# Patient Record
Sex: Female | Born: 1960
Health system: Southern US, Community
[De-identification: ages and names within clinical notes are randomized; demographics above are authoritative.]

---

## 2013-08-24 HISTORY — PX: REDUCTION MAMMAPLASTY: SUR839

## 2015-05-15 ENCOUNTER — Other Ambulatory Visit: Payer: Self-pay

## 2015-05-15 DIAGNOSIS — Z1231 Encounter for screening mammogram for malignant neoplasm of breast: Secondary | ICD-10-CM

## 2015-05-24 ENCOUNTER — Ambulatory Visit
Admission: RE | Admit: 2015-05-24 | Discharge: 2015-05-24 | Disposition: A | Discharge status: Home / Self Care | Payer: Federal, State, Local not specified - PPO | Source: Ambulatory Visit

## 2015-05-24 DIAGNOSIS — Z1231 Encounter for screening mammogram for malignant neoplasm of breast: Secondary | ICD-10-CM

## 2016-05-06 ENCOUNTER — Other Ambulatory Visit (HOSPITAL_COMMUNITY)
Admission: RE | Admit: 2016-05-06 | Discharge: 2016-05-06 | Disposition: A | Discharge status: Home / Self Care | Payer: Federal, State, Local not specified - PPO | Source: Ambulatory Visit | Attending: Family Medicine | Admitting: Family Medicine

## 2016-05-06 ENCOUNTER — Other Ambulatory Visit: Payer: Self-pay | Admitting: Family Medicine

## 2016-05-06 DIAGNOSIS — Z124 Encounter for screening for malignant neoplasm of cervix: Secondary | ICD-10-CM | POA: Diagnosis present

## 2016-05-06 DIAGNOSIS — Z1151 Encounter for screening for human papillomavirus (HPV): Secondary | ICD-10-CM | POA: Insufficient documentation

## 2016-05-07 ENCOUNTER — Other Ambulatory Visit: Payer: Self-pay | Admitting: Family Medicine

## 2016-05-07 DIAGNOSIS — E042 Nontoxic multinodular goiter: Secondary | ICD-10-CM

## 2016-05-08 LAB — CYTOLOGY - PAP

## 2016-06-03 ENCOUNTER — Ambulatory Visit
Admission: RE | Admit: 2016-06-03 | Discharge: 2016-06-03 | Disposition: A | Discharge status: Home / Self Care | Payer: Federal, State, Local not specified - PPO | Source: Ambulatory Visit | Attending: Family Medicine | Admitting: Family Medicine

## 2016-06-03 DIAGNOSIS — E042 Nontoxic multinodular goiter: Secondary | ICD-10-CM

## 2016-06-11 ENCOUNTER — Other Ambulatory Visit: Payer: Self-pay | Admitting: Family Medicine

## 2016-06-11 DIAGNOSIS — E041 Nontoxic single thyroid nodule: Secondary | ICD-10-CM

## 2016-06-23 ENCOUNTER — Other Ambulatory Visit (HOSPITAL_COMMUNITY)
Admission: RE | Admit: 2016-06-23 | Discharge: 2016-06-23 | Disposition: A | Discharge status: Home / Self Care | Payer: Federal, State, Local not specified - PPO | Source: Ambulatory Visit | Attending: Radiology | Admitting: Radiology

## 2016-06-23 ENCOUNTER — Ambulatory Visit
Admission: RE | Admit: 2016-06-23 | Discharge: 2016-06-23 | Disposition: A | Discharge status: Home / Self Care | Payer: Federal, State, Local not specified - PPO | Source: Ambulatory Visit | Attending: Family Medicine | Admitting: Family Medicine

## 2016-06-23 DIAGNOSIS — E041 Nontoxic single thyroid nodule: Secondary | ICD-10-CM

## 2017-04-29 ENCOUNTER — Encounter (HOSPITAL_BASED_OUTPATIENT_CLINIC_OR_DEPARTMENT_OTHER): Payer: Self-pay

## 2017-04-29 DIAGNOSIS — R5383 Other fatigue: Secondary | ICD-10-CM

## 2017-04-29 DIAGNOSIS — G4710 Hypersomnia, unspecified: Secondary | ICD-10-CM

## 2017-04-29 DIAGNOSIS — G4700 Insomnia, unspecified: Secondary | ICD-10-CM

## 2017-04-29 DIAGNOSIS — R0683 Snoring: Secondary | ICD-10-CM

## 2017-06-25 ENCOUNTER — Ambulatory Visit (HOSPITAL_BASED_OUTPATIENT_CLINIC_OR_DEPARTMENT_OTHER): Payer: Federal, State, Local not specified - PPO | Attending: Anesthesiology | Admitting: Internal Medicine

## 2017-06-25 VITALS — Ht 66.0 in | Wt 246.0 lb

## 2017-06-25 DIAGNOSIS — R0683 Snoring: Secondary | ICD-10-CM | POA: Diagnosis not present

## 2017-06-25 DIAGNOSIS — R400 Somnolence: Secondary | ICD-10-CM | POA: Insufficient documentation

## 2017-06-25 DIAGNOSIS — R5383 Other fatigue: Secondary | ICD-10-CM

## 2017-06-25 DIAGNOSIS — G4700 Insomnia, unspecified: Secondary | ICD-10-CM | POA: Diagnosis not present

## 2017-06-25 DIAGNOSIS — G4710 Hypersomnia, unspecified: Secondary | ICD-10-CM

## 2017-07-03 DIAGNOSIS — R0683 Snoring: Secondary | ICD-10-CM

## 2017-07-03 NOTE — Procedures (Signed)
Patient Name: Paige Green, Favor Study Date: 06/25/2017 Gender: Female D.O.B: Feb 04, 1961 Age (years): [age] Referring Provider: Roselie Awkwardharles Plummer Height (inches): 66 Interpreting Physician: Jetty Duhamellinton Glennda Weatherholtz MD, ABSM Weight (lbs): 246 RPSGT: Cherylann ParrDubili, Fred BMI: 40 MRN: [id number] Neck Size: 16.00 CLINICAL INFORMATION Sleep Study Type: NPSG  Indication for sleep study: Excessive Daytime Sleepiness, Fatigue, Obesity, OSA, Snoring, Witnessed Apneas  Epworth Sleepiness Score: 4  SLEEP STUDY TECHNIQUE As per the AASM Manual for the Scoring of Sleep and Associated Events v2.3 (April 2016) with a hypopnea requiring 4% desaturations.  The channels recorded and monitored were frontal, central and occipital EEG, electrooculogram (EOG), submentalis EMG (chin), nasal and oral airflow, thoracic and abdominal wall motion, anterior tibialis EMG, snore microphone, electrocardiogram, and pulse oximetry.  MEDICATIONS Medications self-administered by patient taken the night of the study : HYDROCOD/ACETAMOPHIN  SLEEP ARCHITECTURE The study was initiated at 10:57:03 PM and ended at 5:29:14 AM.  Sleep onset time was 18.1 minutes and the sleep efficiency was 64.9%. The total sleep time was 254.5 minutes.  Stage REM latency was 131.0 minutes.  The patient spent 5.30% of the night in stage N1 sleep, 74.85% in stage N2 sleep, 0.00% in stage N3 and 19.84% in REM.  Alpha intrusion was absent.  Supine sleep was 34.77%.  RESPIRATORY PARAMETERS The overall apnea/hypopnea index (AHI) was 3.5 per hour. There were 8 total apneas, including 7 obstructive, 1 central and 0 mixed apneas. There were 7 hypopneas and 0 RERAs.  The AHI during Stage REM sleep was 16.6 per hour.  AHI while supine was 0.0 per hour.  The mean oxygen saturation was 93.98%. The minimum SpO2 during sleep was 88.00%.  moderate snoring was noted during this study.  CARDIAC DATA The 2 lead EKG demonstrated sinus rhythm. The mean heart rate was  64.35 beats per minute. Other EKG findings include: None.  LEG MOVEMENT DATA The total PLMS were 0 with a resulting PLMS index of 0.00. Associated arousal with leg movement index was 0.0 .  IMPRESSIONS - No significant obstructive sleep apnea occurred during this study (AHI = 3.5/h). - No significant central sleep apnea occurred during this study (CAI = 0.2/h). - The patient had minimal or no oxygen desaturation during the study (Min O2 = 88.00%) - The patient snored with moderate snoring volume. - No cardiac abnormalities were noted during this study. - Clinically significant periodic limb movements did not occur during sleep. No significant associated arousals.  DIAGNOSIS - Primary Snoring (786.09 [R06.83 ICD-10])  RECOMMENDATIONS - Be careful with alcohol, sedatives and other CNS depressants that may worsen sleep apnea and disrupt normal sleep architecture. - Sleep hygiene should be reviewed to assess factors that may improve sleep quality. - Weight management and regular exercise should be initiated or continued if appropriate.  [Electronically signed] 07/03/2017 03:27 PM  Jetty Duhamellinton Daralyn Bert MD, ABSM Diplomate, American Board of Sleep Medicine   NPI: [id number]

## 2017-07-03 NOTE — Procedures (Deleted)
Patient Name: Paige Green, [name] Study Date: 06/25/2017 Gender: Female D.O.B: 09/21/1983 Age (years): 33 Referring Provider: Van ClinesKaren M Aquino Height (inches): 65 Interpreting Physician: Jetty Duhamellinton Allan Minotti MD, ABSM Weight (lbs): 186 RPSGT: Cherylann ParrDubili, Fred BMI: 31 MRN: [id number] Neck Size: 16.00 CLINICAL INFORMATION Sleep Study Type: NPSG  Indication for sleep study: Fatigue, Obesity, Snoring, Witnessed Apneas  Epworth Sleepiness Score: 4  SLEEP STUDY TECHNIQUE As per the AASM Manual for the Scoring of Sleep and Associated Events v2.3 (April 2016) with a hypopnea requiring 4% desaturations.  The channels recorded and monitored were frontal, central and occipital EEG, electrooculogram (EOG), submentalis EMG (chin), nasal and oral airflow, thoracic and abdominal wall motion, anterior tibialis EMG, snore microphone, electrocardiogram, and pulse oximetry.  MEDICATIONS Medications self-administered by patient taken the night of the study : none reported  SLEEP ARCHITECTURE The study was initiated at 10:16:45 PM and ended at 2:48:07 AM.  Sleep onset time was 15.5 minutes and the sleep efficiency was 30.4%. The total sleep time was 82.5 minutes.  Stage REM latency was N/A minutes.  The patient spent 28.48% of the night in stage N1 sleep, 71.52% in stage N2 sleep, 0.00% in stage N3 and 0.00% in REM.  Alpha intrusion was absent.  Supine sleep was 66.67%.  RESPIRATORY PARAMETERS The overall apnea/hypopnea index (AHI) was 5.8 per hour. There were 0 total apneas, including 0 obstructive, 0 central and 0 mixed apneas. There were 8 hypopneas and 0 RERAs.  The AHI during Stage REM sleep was N/A per hour.  AHI while supine was 3.3 per hour.  The mean oxygen saturation was 94.21%. The minimum SpO2 during sleep was 66.00%.  moderate snoring was noted during this study.  CARDIAC DATA The 2 lead EKG demonstrated sinus rhythm. The mean heart rate was 92.35 beats per minute. Other EKG findings  include: None.  LEG MOVEMENT DATA The total PLMS were 0 with a resulting PLMS index of 0.00. Associated arousal with leg movement index was 0.0 .  IMPRESSIONS - Minimal obstructive sleep apnea occurred during this study (AHI = 5.8/h). - No significant central sleep apnea occurred during this study (CAI = 0.0/h). - Oxygen desaturation was noted during this study (Min O2 = 66.00%, Mean 94.2%). - The patient snored with moderate snoring volume. - No cardiac abnormalities were noted during this study. - Clinically significant periodic limb movements did not occur during sleep. No significant associated arousals.  DIAGNOSIS - Obstructive Sleep Apnea (327.23 [G47.33 ICD-10]) - Nocturnal Hypoxemia (327.26 [G47.36 ICD-10])  RECOMMENDATIONS - Very mild obstructive sleep apnea. If more than conservative intervention is needed, an oral appliance, CPAP or ENT evaluation might be considered, based on clinical judgment. - Avoid alcohol, sedatives and other CNS depressants that may worsen sleep apnea and disrupt normal sleep architecture. - Sleep hygiene should be reviewed to assess factors that may improve sleep quality. - Weight management and regular exercise should be initiated or continued if appropriate.  [Electronically signed] 07/03/2017 03:21 PM  Jetty Duhamellinton Camelle Henkels MD, ABSM Diplomate, American Board of Sleep Medicine   NPI: [id number]

## 2017-07-07 DIAGNOSIS — M545 Low back pain: Secondary | ICD-10-CM | POA: Diagnosis not present

## 2017-07-07 DIAGNOSIS — M542 Cervicalgia: Secondary | ICD-10-CM | POA: Diagnosis not present

## 2017-07-07 DIAGNOSIS — G894 Chronic pain syndrome: Secondary | ICD-10-CM | POA: Diagnosis not present

## 2017-07-12 DIAGNOSIS — M791 Myalgia, unspecified site: Secondary | ICD-10-CM | POA: Diagnosis not present

## 2017-08-09 DIAGNOSIS — M150 Primary generalized (osteo)arthritis: Secondary | ICD-10-CM | POA: Diagnosis not present

## 2017-08-09 DIAGNOSIS — M797 Fibromyalgia: Secondary | ICD-10-CM | POA: Diagnosis not present

## 2017-08-09 DIAGNOSIS — M0579 Rheumatoid arthritis with rheumatoid factor of multiple sites without organ or systems involvement: Secondary | ICD-10-CM | POA: Diagnosis not present

## 2017-08-09 DIAGNOSIS — M2550 Pain in unspecified joint: Secondary | ICD-10-CM | POA: Diagnosis not present

## 2017-09-14 DIAGNOSIS — G894 Chronic pain syndrome: Secondary | ICD-10-CM | POA: Diagnosis not present

## 2017-09-14 DIAGNOSIS — Z79891 Long term (current) use of opiate analgesic: Secondary | ICD-10-CM | POA: Diagnosis not present

## 2017-09-14 DIAGNOSIS — M545 Low back pain: Secondary | ICD-10-CM | POA: Diagnosis not present

## 2017-09-14 DIAGNOSIS — M542 Cervicalgia: Secondary | ICD-10-CM | POA: Diagnosis not present

## 2017-09-22 DIAGNOSIS — M069 Rheumatoid arthritis, unspecified: Secondary | ICD-10-CM | POA: Diagnosis not present

## 2017-09-22 DIAGNOSIS — R079 Chest pain, unspecified: Secondary | ICD-10-CM | POA: Diagnosis not present

## 2017-09-22 DIAGNOSIS — I10 Essential (primary) hypertension: Secondary | ICD-10-CM | POA: Diagnosis not present

## 2017-09-22 DIAGNOSIS — Z1159 Encounter for screening for other viral diseases: Secondary | ICD-10-CM | POA: Diagnosis not present

## 2017-09-22 DIAGNOSIS — Z23 Encounter for immunization: Secondary | ICD-10-CM | POA: Diagnosis not present

## 2017-09-22 DIAGNOSIS — Z720 Tobacco use: Secondary | ICD-10-CM | POA: Diagnosis not present

## 2017-09-30 ENCOUNTER — Other Ambulatory Visit: Payer: Self-pay | Admitting: Cardiology

## 2017-09-30 ENCOUNTER — Other Ambulatory Visit: Payer: Self-pay | Admitting: Family Medicine

## 2017-09-30 ENCOUNTER — Ambulatory Visit
Admission: RE | Admit: 2017-09-30 | Discharge: 2017-09-30 | Disposition: A | Discharge status: Home / Self Care | Payer: Federal, State, Local not specified - PPO | Source: Ambulatory Visit | Attending: Cardiology | Admitting: Cardiology

## 2017-09-30 DIAGNOSIS — G4733 Obstructive sleep apnea (adult) (pediatric): Secondary | ICD-10-CM | POA: Diagnosis not present

## 2017-09-30 DIAGNOSIS — R079 Chest pain, unspecified: Secondary | ICD-10-CM | POA: Diagnosis not present

## 2017-09-30 DIAGNOSIS — R0602 Shortness of breath: Secondary | ICD-10-CM | POA: Diagnosis not present

## 2017-09-30 DIAGNOSIS — Z1231 Encounter for screening mammogram for malignant neoplasm of breast: Secondary | ICD-10-CM

## 2017-09-30 DIAGNOSIS — I10 Essential (primary) hypertension: Secondary | ICD-10-CM | POA: Diagnosis not present

## 2017-10-11 DIAGNOSIS — R079 Chest pain, unspecified: Secondary | ICD-10-CM | POA: Diagnosis not present

## 2017-10-12 DIAGNOSIS — M545 Low back pain: Secondary | ICD-10-CM | POA: Diagnosis not present

## 2017-10-12 DIAGNOSIS — M542 Cervicalgia: Secondary | ICD-10-CM | POA: Diagnosis not present

## 2017-10-12 DIAGNOSIS — G894 Chronic pain syndrome: Secondary | ICD-10-CM | POA: Diagnosis not present

## 2017-10-12 DIAGNOSIS — Z79891 Long term (current) use of opiate analgesic: Secondary | ICD-10-CM | POA: Diagnosis not present

## 2017-10-14 DIAGNOSIS — R079 Chest pain, unspecified: Secondary | ICD-10-CM | POA: Diagnosis not present

## 2017-11-04 ENCOUNTER — Ambulatory Visit: Payer: Federal, State, Local not specified - PPO

## 2017-11-08 DIAGNOSIS — M797 Fibromyalgia: Secondary | ICD-10-CM | POA: Diagnosis not present

## 2017-11-08 DIAGNOSIS — M150 Primary generalized (osteo)arthritis: Secondary | ICD-10-CM | POA: Diagnosis not present

## 2017-11-08 DIAGNOSIS — M2550 Pain in unspecified joint: Secondary | ICD-10-CM | POA: Diagnosis not present

## 2017-11-08 DIAGNOSIS — M0609 Rheumatoid arthritis without rheumatoid factor, multiple sites: Secondary | ICD-10-CM | POA: Diagnosis not present

## 2017-11-22 DIAGNOSIS — I10 Essential (primary) hypertension: Secondary | ICD-10-CM | POA: Diagnosis not present

## 2017-11-23 ENCOUNTER — Ambulatory Visit
Admission: RE | Admit: 2017-11-23 | Discharge: 2017-11-23 | Disposition: A | Discharge status: Home / Self Care | Payer: Federal, State, Local not specified - PPO | Source: Ambulatory Visit | Attending: Family Medicine | Admitting: Family Medicine

## 2017-11-23 DIAGNOSIS — Z1231 Encounter for screening mammogram for malignant neoplasm of breast: Secondary | ICD-10-CM

## 2017-12-17 DIAGNOSIS — G894 Chronic pain syndrome: Secondary | ICD-10-CM | POA: Diagnosis not present

## 2017-12-17 DIAGNOSIS — Z79891 Long term (current) use of opiate analgesic: Secondary | ICD-10-CM | POA: Diagnosis not present

## 2017-12-17 DIAGNOSIS — M545 Low back pain: Secondary | ICD-10-CM | POA: Diagnosis not present

## 2017-12-17 DIAGNOSIS — M542 Cervicalgia: Secondary | ICD-10-CM | POA: Diagnosis not present

## 2018-01-10 DIAGNOSIS — M150 Primary generalized (osteo)arthritis: Secondary | ICD-10-CM | POA: Diagnosis not present

## 2018-01-10 DIAGNOSIS — M797 Fibromyalgia: Secondary | ICD-10-CM | POA: Diagnosis not present

## 2018-01-10 DIAGNOSIS — M2550 Pain in unspecified joint: Secondary | ICD-10-CM | POA: Diagnosis not present

## 2018-01-10 DIAGNOSIS — M0609 Rheumatoid arthritis without rheumatoid factor, multiple sites: Secondary | ICD-10-CM | POA: Diagnosis not present

## 2018-01-24 DIAGNOSIS — G894 Chronic pain syndrome: Secondary | ICD-10-CM | POA: Diagnosis not present

## 2018-01-24 DIAGNOSIS — M542 Cervicalgia: Secondary | ICD-10-CM | POA: Diagnosis not present

## 2018-01-24 DIAGNOSIS — Z79891 Long term (current) use of opiate analgesic: Secondary | ICD-10-CM | POA: Diagnosis not present

## 2018-01-24 DIAGNOSIS — M545 Low back pain: Secondary | ICD-10-CM | POA: Diagnosis not present

## 2018-02-28 DIAGNOSIS — G894 Chronic pain syndrome: Secondary | ICD-10-CM | POA: Diagnosis not present

## 2018-02-28 DIAGNOSIS — M542 Cervicalgia: Secondary | ICD-10-CM | POA: Diagnosis not present

## 2018-02-28 DIAGNOSIS — M545 Low back pain: Secondary | ICD-10-CM | POA: Diagnosis not present

## 2018-02-28 DIAGNOSIS — Z79891 Long term (current) use of opiate analgesic: Secondary | ICD-10-CM | POA: Diagnosis not present

## 2018-03-28 DIAGNOSIS — M545 Low back pain: Secondary | ICD-10-CM | POA: Diagnosis not present

## 2018-03-28 DIAGNOSIS — G894 Chronic pain syndrome: Secondary | ICD-10-CM | POA: Diagnosis not present

## 2018-03-28 DIAGNOSIS — Z79891 Long term (current) use of opiate analgesic: Secondary | ICD-10-CM | POA: Diagnosis not present

## 2018-03-28 DIAGNOSIS — M542 Cervicalgia: Secondary | ICD-10-CM | POA: Diagnosis not present

## 2018-04-11 DIAGNOSIS — M150 Primary generalized (osteo)arthritis: Secondary | ICD-10-CM | POA: Diagnosis not present

## 2018-04-11 DIAGNOSIS — M797 Fibromyalgia: Secondary | ICD-10-CM | POA: Diagnosis not present

## 2018-04-11 DIAGNOSIS — M0579 Rheumatoid arthritis with rheumatoid factor of multiple sites without organ or systems involvement: Secondary | ICD-10-CM | POA: Diagnosis not present

## 2018-04-11 DIAGNOSIS — M2550 Pain in unspecified joint: Secondary | ICD-10-CM | POA: Diagnosis not present

## 2018-05-03 DIAGNOSIS — M542 Cervicalgia: Secondary | ICD-10-CM | POA: Diagnosis not present

## 2018-05-03 DIAGNOSIS — Z79891 Long term (current) use of opiate analgesic: Secondary | ICD-10-CM | POA: Diagnosis not present

## 2018-05-03 DIAGNOSIS — G894 Chronic pain syndrome: Secondary | ICD-10-CM | POA: Diagnosis not present

## 2018-05-03 DIAGNOSIS — M545 Low back pain: Secondary | ICD-10-CM | POA: Diagnosis not present

## 2018-05-26 DIAGNOSIS — M545 Low back pain: Secondary | ICD-10-CM | POA: Diagnosis not present

## 2018-05-26 DIAGNOSIS — M542 Cervicalgia: Secondary | ICD-10-CM | POA: Diagnosis not present

## 2018-05-26 DIAGNOSIS — G894 Chronic pain syndrome: Secondary | ICD-10-CM | POA: Diagnosis not present

## 2018-05-26 DIAGNOSIS — Z79891 Long term (current) use of opiate analgesic: Secondary | ICD-10-CM | POA: Diagnosis not present

## 2018-06-27 DIAGNOSIS — M797 Fibromyalgia: Secondary | ICD-10-CM | POA: Diagnosis not present

## 2018-06-27 DIAGNOSIS — M0579 Rheumatoid arthritis with rheumatoid factor of multiple sites without organ or systems involvement: Secondary | ICD-10-CM | POA: Diagnosis not present

## 2018-06-27 DIAGNOSIS — G894 Chronic pain syndrome: Secondary | ICD-10-CM | POA: Diagnosis not present

## 2018-06-27 DIAGNOSIS — M545 Low back pain: Secondary | ICD-10-CM | POA: Diagnosis not present

## 2018-06-27 DIAGNOSIS — M150 Primary generalized (osteo)arthritis: Secondary | ICD-10-CM | POA: Diagnosis not present

## 2018-06-27 DIAGNOSIS — M2550 Pain in unspecified joint: Secondary | ICD-10-CM | POA: Diagnosis not present

## 2018-06-27 DIAGNOSIS — Z79891 Long term (current) use of opiate analgesic: Secondary | ICD-10-CM | POA: Diagnosis not present

## 2018-06-27 DIAGNOSIS — M542 Cervicalgia: Secondary | ICD-10-CM | POA: Diagnosis not present

## 2018-08-29 DIAGNOSIS — G894 Chronic pain syndrome: Secondary | ICD-10-CM | POA: Diagnosis not present

## 2018-08-29 DIAGNOSIS — M545 Low back pain: Secondary | ICD-10-CM | POA: Diagnosis not present

## 2018-08-29 DIAGNOSIS — Z79891 Long term (current) use of opiate analgesic: Secondary | ICD-10-CM | POA: Diagnosis not present

## 2018-08-29 DIAGNOSIS — M150 Primary generalized (osteo)arthritis: Secondary | ICD-10-CM | POA: Diagnosis not present

## 2018-08-29 DIAGNOSIS — M2550 Pain in unspecified joint: Secondary | ICD-10-CM | POA: Diagnosis not present

## 2018-08-29 DIAGNOSIS — M797 Fibromyalgia: Secondary | ICD-10-CM | POA: Diagnosis not present

## 2018-08-29 DIAGNOSIS — M0579 Rheumatoid arthritis with rheumatoid factor of multiple sites without organ or systems involvement: Secondary | ICD-10-CM | POA: Diagnosis not present

## 2018-08-29 DIAGNOSIS — M542 Cervicalgia: Secondary | ICD-10-CM | POA: Diagnosis not present

## 2018-10-03 DIAGNOSIS — I10 Essential (primary) hypertension: Secondary | ICD-10-CM | POA: Diagnosis not present

## 2018-10-03 DIAGNOSIS — E042 Nontoxic multinodular goiter: Secondary | ICD-10-CM | POA: Diagnosis not present

## 2018-10-03 DIAGNOSIS — M069 Rheumatoid arthritis, unspecified: Secondary | ICD-10-CM | POA: Diagnosis not present

## 2018-10-03 DIAGNOSIS — R1310 Dysphagia, unspecified: Secondary | ICD-10-CM | POA: Diagnosis not present

## 2018-10-04 ENCOUNTER — Other Ambulatory Visit: Payer: Self-pay | Admitting: Family Medicine

## 2018-10-04 DIAGNOSIS — E042 Nontoxic multinodular goiter: Secondary | ICD-10-CM

## 2018-10-04 DIAGNOSIS — R1310 Dysphagia, unspecified: Secondary | ICD-10-CM

## 2018-10-10 ENCOUNTER — Ambulatory Visit
Admission: RE | Admit: 2018-10-10 | Discharge: 2018-10-10 | Disposition: A | Discharge status: Home / Self Care | Payer: Federal, State, Local not specified - PPO | Source: Ambulatory Visit | Attending: Family Medicine | Admitting: Family Medicine

## 2018-10-10 DIAGNOSIS — E042 Nontoxic multinodular goiter: Secondary | ICD-10-CM

## 2018-10-10 DIAGNOSIS — E010 Iodine-deficiency related diffuse (endemic) goiter: Secondary | ICD-10-CM | POA: Diagnosis not present

## 2018-10-10 DIAGNOSIS — R1310 Dysphagia, unspecified: Secondary | ICD-10-CM

## 2018-12-19 DIAGNOSIS — M545 Low back pain: Secondary | ICD-10-CM | POA: Diagnosis not present

## 2018-12-19 DIAGNOSIS — G894 Chronic pain syndrome: Secondary | ICD-10-CM | POA: Diagnosis not present

## 2018-12-19 DIAGNOSIS — Z79891 Long term (current) use of opiate analgesic: Secondary | ICD-10-CM | POA: Diagnosis not present

## 2018-12-19 DIAGNOSIS — M542 Cervicalgia: Secondary | ICD-10-CM | POA: Diagnosis not present

## 2019-01-10 DIAGNOSIS — Z79899 Other long term (current) drug therapy: Secondary | ICD-10-CM | POA: Diagnosis not present

## 2019-01-10 DIAGNOSIS — M0579 Rheumatoid arthritis with rheumatoid factor of multiple sites without organ or systems involvement: Secondary | ICD-10-CM | POA: Diagnosis not present

## 2019-02-14 DIAGNOSIS — E559 Vitamin D deficiency, unspecified: Secondary | ICD-10-CM | POA: Diagnosis not present

## 2019-02-14 DIAGNOSIS — I10 Essential (primary) hypertension: Secondary | ICD-10-CM | POA: Diagnosis not present

## 2019-02-14 DIAGNOSIS — R7989 Other specified abnormal findings of blood chemistry: Secondary | ICD-10-CM | POA: Diagnosis not present

## 2019-02-14 DIAGNOSIS — Z8249 Family history of ischemic heart disease and other diseases of the circulatory system: Secondary | ICD-10-CM | POA: Diagnosis not present

## 2019-02-14 DIAGNOSIS — R0609 Other forms of dyspnea: Secondary | ICD-10-CM | POA: Diagnosis not present

## 2019-02-14 DIAGNOSIS — R9431 Abnormal electrocardiogram [ECG] [EKG]: Secondary | ICD-10-CM | POA: Diagnosis not present

## 2019-02-14 DIAGNOSIS — Z131 Encounter for screening for diabetes mellitus: Secondary | ICD-10-CM | POA: Diagnosis not present

## 2019-02-14 DIAGNOSIS — E7800 Pure hypercholesterolemia, unspecified: Secondary | ICD-10-CM | POA: Diagnosis not present

## 2019-02-15 DIAGNOSIS — M542 Cervicalgia: Secondary | ICD-10-CM | POA: Diagnosis not present

## 2019-02-15 DIAGNOSIS — G894 Chronic pain syndrome: Secondary | ICD-10-CM | POA: Diagnosis not present

## 2019-02-15 DIAGNOSIS — Z79891 Long term (current) use of opiate analgesic: Secondary | ICD-10-CM | POA: Diagnosis not present

## 2019-02-15 DIAGNOSIS — M545 Low back pain: Secondary | ICD-10-CM | POA: Diagnosis not present

## 2019-02-28 DIAGNOSIS — I10 Essential (primary) hypertension: Secondary | ICD-10-CM | POA: Diagnosis not present

## 2019-03-28 DIAGNOSIS — M0579 Rheumatoid arthritis with rheumatoid factor of multiple sites without organ or systems involvement: Secondary | ICD-10-CM | POA: Diagnosis not present

## 2019-03-28 DIAGNOSIS — Z79899 Other long term (current) drug therapy: Secondary | ICD-10-CM | POA: Diagnosis not present

## 2019-04-12 DIAGNOSIS — Z79891 Long term (current) use of opiate analgesic: Secondary | ICD-10-CM | POA: Diagnosis not present

## 2019-04-12 DIAGNOSIS — G894 Chronic pain syndrome: Secondary | ICD-10-CM | POA: Diagnosis not present

## 2019-04-12 DIAGNOSIS — M545 Low back pain: Secondary | ICD-10-CM | POA: Diagnosis not present

## 2019-04-12 DIAGNOSIS — M542 Cervicalgia: Secondary | ICD-10-CM | POA: Diagnosis not present

## 2019-05-18 DIAGNOSIS — R7303 Prediabetes: Secondary | ICD-10-CM | POA: Diagnosis not present

## 2019-05-18 DIAGNOSIS — D649 Anemia, unspecified: Secondary | ICD-10-CM | POA: Diagnosis not present

## 2019-05-18 DIAGNOSIS — M25511 Pain in right shoulder: Secondary | ICD-10-CM | POA: Diagnosis not present

## 2019-05-18 DIAGNOSIS — M069 Rheumatoid arthritis, unspecified: Secondary | ICD-10-CM | POA: Diagnosis not present

## 2019-05-18 DIAGNOSIS — R7989 Other specified abnormal findings of blood chemistry: Secondary | ICD-10-CM | POA: Diagnosis not present

## 2019-06-12 DIAGNOSIS — M542 Cervicalgia: Secondary | ICD-10-CM | POA: Diagnosis not present

## 2019-06-12 DIAGNOSIS — M545 Low back pain: Secondary | ICD-10-CM | POA: Diagnosis not present

## 2019-06-12 DIAGNOSIS — Z79891 Long term (current) use of opiate analgesic: Secondary | ICD-10-CM | POA: Diagnosis not present

## 2019-06-12 DIAGNOSIS — G894 Chronic pain syndrome: Secondary | ICD-10-CM | POA: Diagnosis not present

## 2019-06-21 DIAGNOSIS — I361 Nonrheumatic tricuspid (valve) insufficiency: Secondary | ICD-10-CM | POA: Diagnosis not present

## 2019-06-21 DIAGNOSIS — I10 Essential (primary) hypertension: Secondary | ICD-10-CM | POA: Diagnosis not present

## 2019-07-31 DIAGNOSIS — M2550 Pain in unspecified joint: Secondary | ICD-10-CM | POA: Diagnosis not present

## 2019-07-31 DIAGNOSIS — M150 Primary generalized (osteo)arthritis: Secondary | ICD-10-CM | POA: Diagnosis not present

## 2019-07-31 DIAGNOSIS — M0579 Rheumatoid arthritis with rheumatoid factor of multiple sites without organ or systems involvement: Secondary | ICD-10-CM | POA: Diagnosis not present

## 2019-07-31 DIAGNOSIS — M797 Fibromyalgia: Secondary | ICD-10-CM | POA: Diagnosis not present

## 2019-08-09 DIAGNOSIS — M545 Low back pain: Secondary | ICD-10-CM | POA: Diagnosis not present

## 2019-08-09 DIAGNOSIS — M542 Cervicalgia: Secondary | ICD-10-CM | POA: Diagnosis not present

## 2019-08-09 DIAGNOSIS — G894 Chronic pain syndrome: Secondary | ICD-10-CM | POA: Diagnosis not present

## 2019-08-09 DIAGNOSIS — Z79891 Long term (current) use of opiate analgesic: Secondary | ICD-10-CM | POA: Diagnosis not present

## 2019-09-07 DIAGNOSIS — R0602 Shortness of breath: Secondary | ICD-10-CM | POA: Diagnosis not present

## 2019-09-07 DIAGNOSIS — J40 Bronchitis, not specified as acute or chronic: Secondary | ICD-10-CM | POA: Diagnosis not present

## 2019-09-07 DIAGNOSIS — R05 Cough: Secondary | ICD-10-CM | POA: Diagnosis not present

## 2019-10-11 DIAGNOSIS — G894 Chronic pain syndrome: Secondary | ICD-10-CM | POA: Diagnosis not present

## 2019-10-11 DIAGNOSIS — Z79891 Long term (current) use of opiate analgesic: Secondary | ICD-10-CM | POA: Diagnosis not present

## 2019-10-11 DIAGNOSIS — M545 Low back pain: Secondary | ICD-10-CM | POA: Diagnosis not present

## 2019-10-11 DIAGNOSIS — M542 Cervicalgia: Secondary | ICD-10-CM | POA: Diagnosis not present

## 2019-10-30 DIAGNOSIS — M2550 Pain in unspecified joint: Secondary | ICD-10-CM | POA: Diagnosis not present

## 2019-10-30 DIAGNOSIS — M0579 Rheumatoid arthritis with rheumatoid factor of multiple sites without organ or systems involvement: Secondary | ICD-10-CM | POA: Diagnosis not present

## 2019-10-30 DIAGNOSIS — M797 Fibromyalgia: Secondary | ICD-10-CM | POA: Diagnosis not present

## 2019-12-11 DIAGNOSIS — Z79891 Long term (current) use of opiate analgesic: Secondary | ICD-10-CM | POA: Diagnosis not present

## 2019-12-11 DIAGNOSIS — M542 Cervicalgia: Secondary | ICD-10-CM | POA: Diagnosis not present

## 2019-12-11 DIAGNOSIS — G894 Chronic pain syndrome: Secondary | ICD-10-CM | POA: Diagnosis not present

## 2019-12-11 DIAGNOSIS — M545 Low back pain: Secondary | ICD-10-CM | POA: Diagnosis not present

## 2020-02-02 DIAGNOSIS — M0579 Rheumatoid arthritis with rheumatoid factor of multiple sites without organ or systems involvement: Secondary | ICD-10-CM | POA: Diagnosis not present

## 2020-02-02 DIAGNOSIS — M2550 Pain in unspecified joint: Secondary | ICD-10-CM | POA: Diagnosis not present

## 2020-02-02 DIAGNOSIS — M797 Fibromyalgia: Secondary | ICD-10-CM | POA: Diagnosis not present

## 2020-02-29 DIAGNOSIS — M0579 Rheumatoid arthritis with rheumatoid factor of multiple sites without organ or systems involvement: Secondary | ICD-10-CM | POA: Diagnosis not present

## 2020-04-01 ENCOUNTER — Other Ambulatory Visit: Payer: Self-pay | Admitting: Family Medicine

## 2020-04-01 DIAGNOSIS — Z1231 Encounter for screening mammogram for malignant neoplasm of breast: Secondary | ICD-10-CM

## 2020-04-12 ENCOUNTER — Other Ambulatory Visit: Payer: Self-pay

## 2020-04-12 ENCOUNTER — Ambulatory Visit
Admission: RE | Admit: 2020-04-12 | Discharge: 2020-04-12 | Disposition: A | Discharge status: Home / Self Care | Payer: Federal, State, Local not specified - PPO | Source: Ambulatory Visit | Attending: Family Medicine | Admitting: Family Medicine

## 2020-04-12 DIAGNOSIS — M069 Rheumatoid arthritis, unspecified: Secondary | ICD-10-CM | POA: Diagnosis not present

## 2020-04-12 DIAGNOSIS — G8929 Other chronic pain: Secondary | ICD-10-CM | POA: Diagnosis not present

## 2020-04-12 DIAGNOSIS — F17210 Nicotine dependence, cigarettes, uncomplicated: Secondary | ICD-10-CM | POA: Diagnosis not present

## 2020-04-12 DIAGNOSIS — R7303 Prediabetes: Secondary | ICD-10-CM | POA: Diagnosis not present

## 2020-04-12 DIAGNOSIS — I10 Essential (primary) hypertension: Secondary | ICD-10-CM | POA: Diagnosis not present

## 2020-04-12 DIAGNOSIS — R5383 Other fatigue: Secondary | ICD-10-CM | POA: Diagnosis not present

## 2020-04-12 DIAGNOSIS — Z1231 Encounter for screening mammogram for malignant neoplasm of breast: Secondary | ICD-10-CM | POA: Diagnosis not present

## 2020-05-03 DIAGNOSIS — M545 Low back pain: Secondary | ICD-10-CM | POA: Diagnosis not present

## 2020-05-03 DIAGNOSIS — M542 Cervicalgia: Secondary | ICD-10-CM | POA: Diagnosis not present

## 2020-07-12 DIAGNOSIS — E7800 Pure hypercholesterolemia, unspecified: Secondary | ICD-10-CM | POA: Diagnosis not present

## 2020-09-06 DIAGNOSIS — M150 Primary generalized (osteo)arthritis: Secondary | ICD-10-CM | POA: Diagnosis not present

## 2020-09-06 DIAGNOSIS — M0579 Rheumatoid arthritis with rheumatoid factor of multiple sites without organ or systems involvement: Secondary | ICD-10-CM | POA: Diagnosis not present

## 2020-09-06 DIAGNOSIS — M2550 Pain in unspecified joint: Secondary | ICD-10-CM | POA: Diagnosis not present

## 2020-09-06 DIAGNOSIS — M797 Fibromyalgia: Secondary | ICD-10-CM | POA: Diagnosis not present

## 2020-10-11 DIAGNOSIS — D649 Anemia, unspecified: Secondary | ICD-10-CM | POA: Diagnosis not present

## 2020-11-11 IMAGING — US US THYROID
1 series · 13 of 25 positions shown · non-contrast
Comparison: 06/03/2016

CLINICAL DATA: Right thyromegaly, difficulty swallowing . Previous
FNA biopsy of inferior right nodule 06/23/2016.

EXAM:
THYROID ULTRASOUND
TECHNIQUE: Ultrasound examination of the thyroid gland and adjacent soft
tissues was performed.

[Series 1: us thyroid · 0.05mm/px · 13 of 71 slices shown]
[im 1/71]
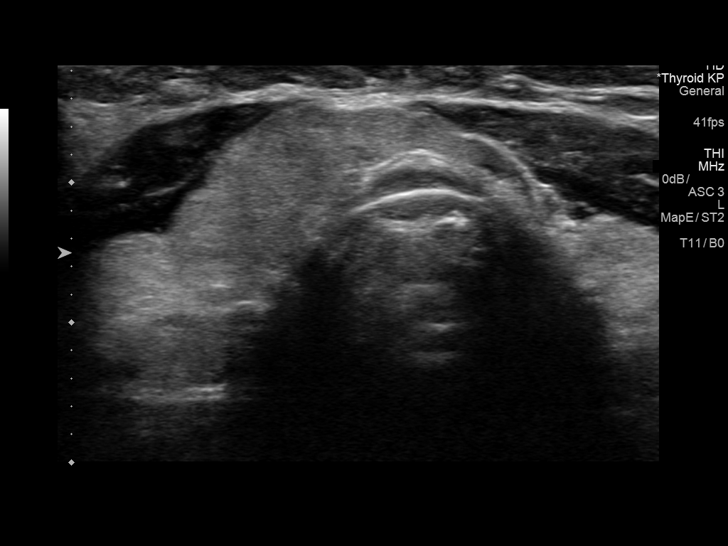
[im 6/71]
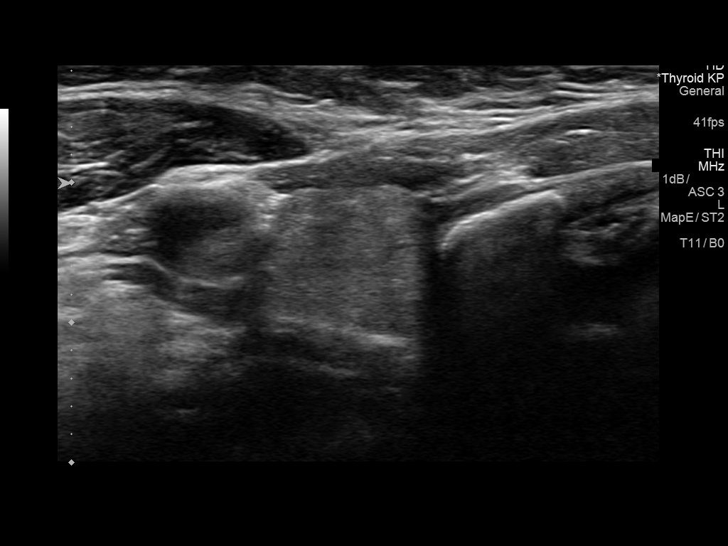
[im 12/71]
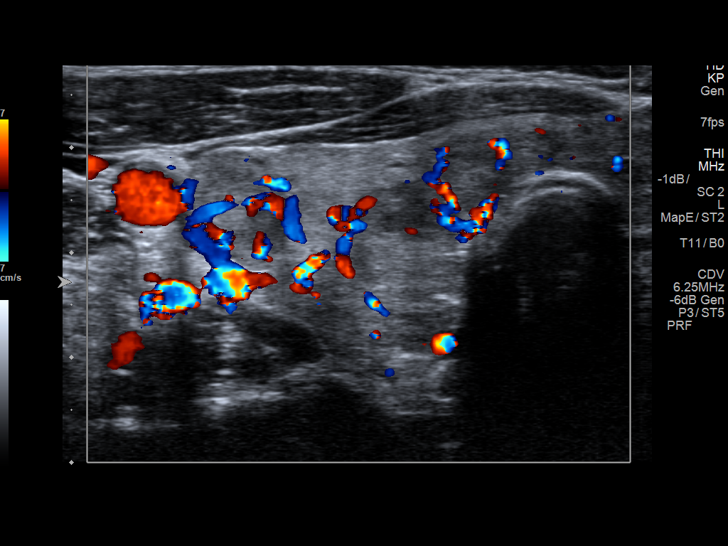
[im 18/71]
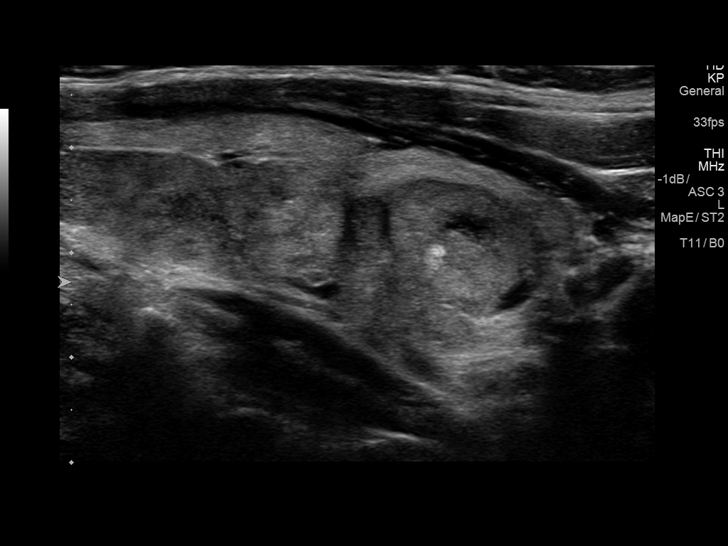
[im 24/71]
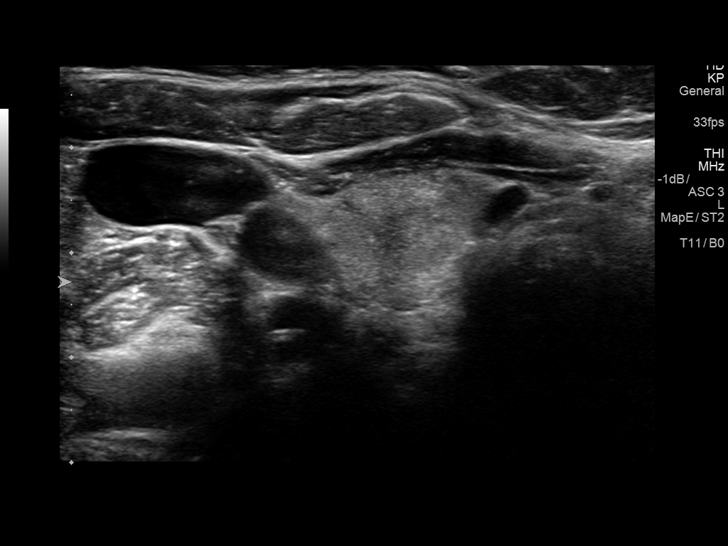
[im 30/71]
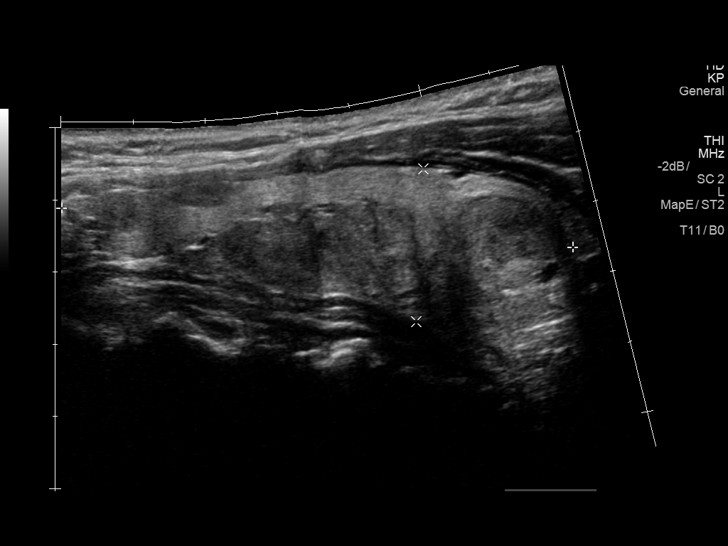
[im 36/71]
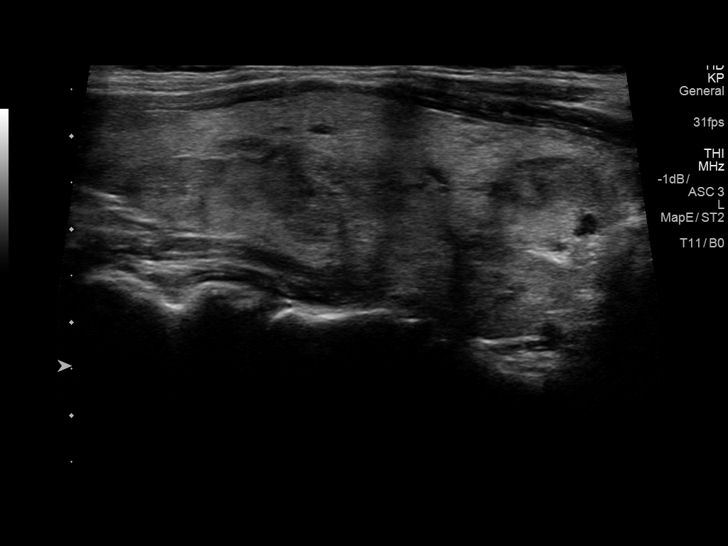
[im 41/71]
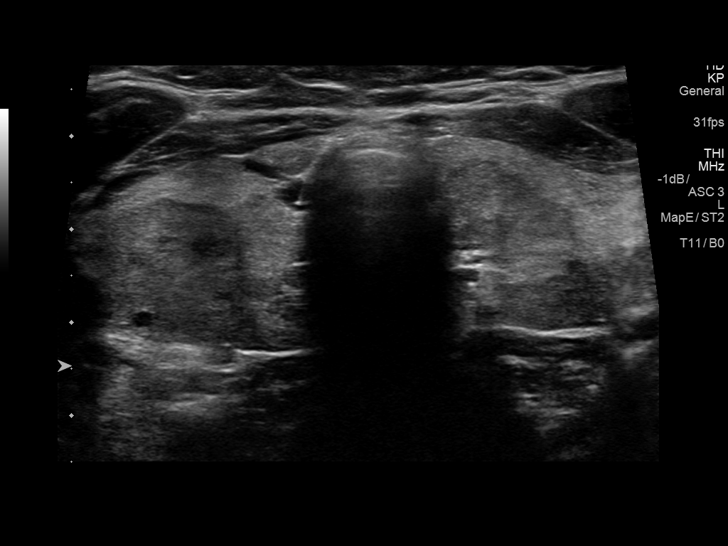
[im 47/71]
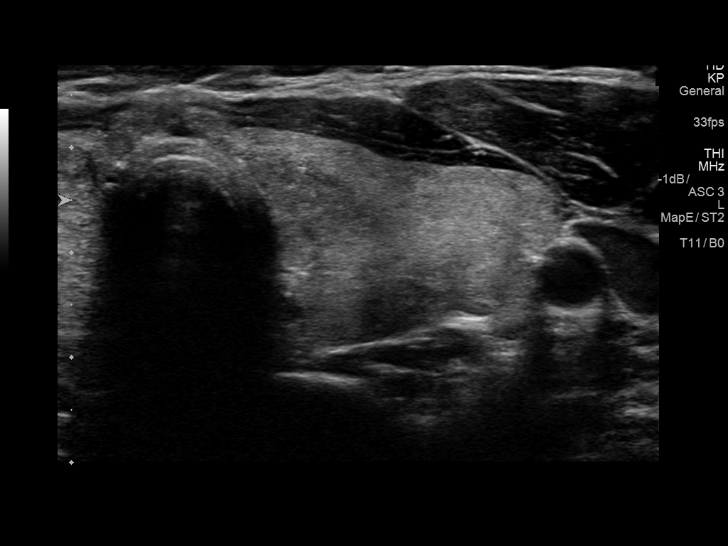
[im 53/71]
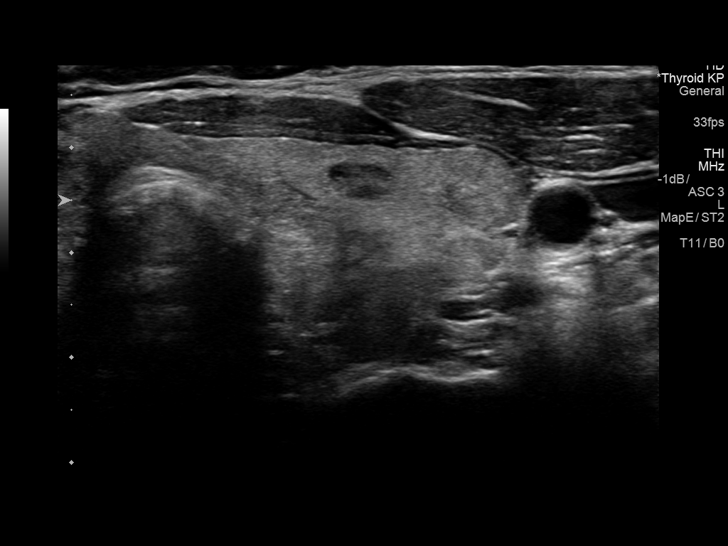
[im 59/71]
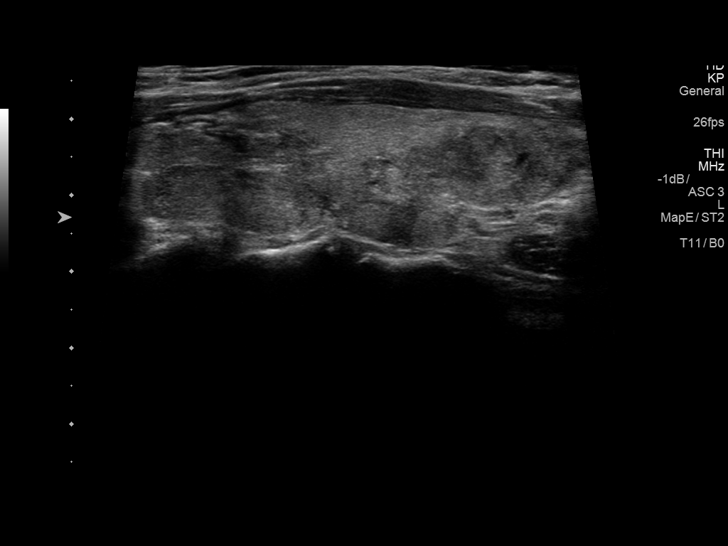
[im 65/71]
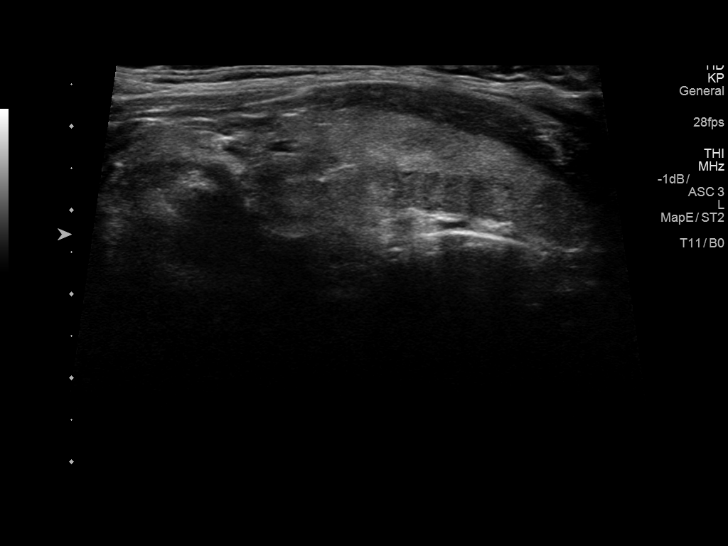
[im 71/71]
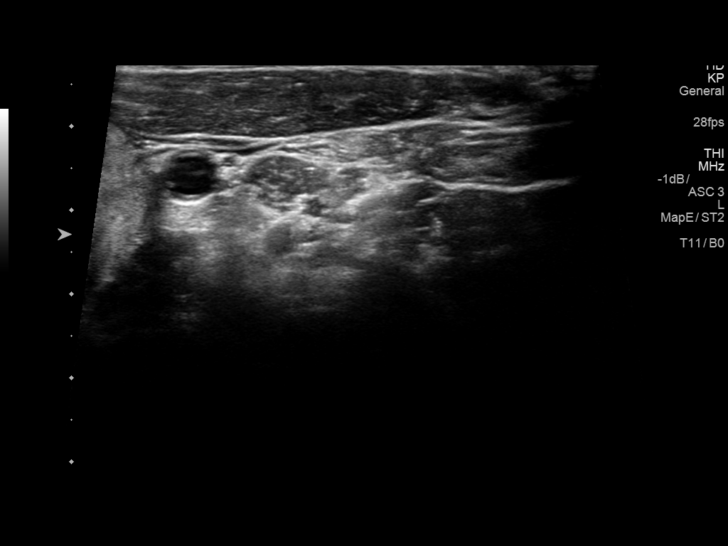

[13 of 25 positions shown; findings below may reference images not displayed]

FINDINGS: Parenchymal Echotexture: Moderately heterogenous

Isthmus: 0.5 cm thickness, stable

Right lobe: 7.1 x 2.1 x 2.9 cm, previously 7.5 x 2.8 x 4

Left lobe: 6.5 x 1.9 x 2.7 cm, previously 6.4 x 2.1 x 3

_________________________________________________________

Estimated total number of nodules >/= 1 cm: 2

Number of spongiform nodules >/=  2 cm not described below (TR1): 0

Number of mixed cystic and solid nodules >/= 1.5 cm not described
below (TR2): 0

_________________________________________________________

Nodule # 1: 1.7 x 1.6 x 1.1 cm inferior right, previously 1.7 cm;
this was previously biopsied

Nodule # 2:

Prior biopsy: No

Location: Left; Mid

Maximum size: 0.6 cm; Other 2 dimensions: 0.6 x 0.4 cm, previously,
0.6 x 0.5 x 0.3 cm

Composition: solid/almost completely solid (2)

Echogenicity: hypoechoic (2)

Shape: not taller-than-wide (0)

Margins: smooth (0)

Echogenic foci: none (0)

ACR TI-RADS total points: 4.

ACR TI-RADS risk category:  TR4 (4-6 points).

Significant change in size (>/= 20% in two dimensions and minimal
increase of 2 mm): No

Change in features: No

Change in ACR TI-RADS risk category: No

ACR TI-RADS recommendations:

Given size (<0.9 cm) and appearance, this nodule does NOT meet
TI-RADS criteria for biopsy or dedicated follow-up.
IMPRESSION: 1. Stable thyromegaly with bilateral nodules.
Neither meets  criteria for biopsy or dedicated imaging follow-up

The above is in keeping with the ACR TI-RADS recommendations - [HOSPITAL] 9361;[DATE].

## 2020-11-22 DIAGNOSIS — M0579 Rheumatoid arthritis with rheumatoid factor of multiple sites without organ or systems involvement: Secondary | ICD-10-CM | POA: Diagnosis not present

## 2020-11-22 DIAGNOSIS — M2550 Pain in unspecified joint: Secondary | ICD-10-CM | POA: Diagnosis not present

## 2020-11-22 DIAGNOSIS — M797 Fibromyalgia: Secondary | ICD-10-CM | POA: Diagnosis not present

## 2020-11-22 DIAGNOSIS — M5431 Sciatica, right side: Secondary | ICD-10-CM | POA: Diagnosis not present

## 2021-03-17 ENCOUNTER — Other Ambulatory Visit: Payer: Self-pay | Admitting: Family Medicine

## 2021-03-17 DIAGNOSIS — Z1231 Encounter for screening mammogram for malignant neoplasm of breast: Secondary | ICD-10-CM

## 2021-05-09 ENCOUNTER — Other Ambulatory Visit: Payer: Self-pay

## 2021-05-09 ENCOUNTER — Ambulatory Visit
Admission: RE | Admit: 2021-05-09 | Discharge: 2021-05-09 | Disposition: A | Discharge status: Home / Self Care | Payer: Federal, State, Local not specified - PPO | Source: Ambulatory Visit | Attending: Family Medicine | Admitting: Family Medicine

## 2021-05-09 DIAGNOSIS — Z1231 Encounter for screening mammogram for malignant neoplasm of breast: Secondary | ICD-10-CM

## 2021-06-06 DIAGNOSIS — I10 Essential (primary) hypertension: Secondary | ICD-10-CM | POA: Diagnosis not present

## 2021-06-06 DIAGNOSIS — R7303 Prediabetes: Secondary | ICD-10-CM | POA: Diagnosis not present

## 2021-06-06 DIAGNOSIS — M069 Rheumatoid arthritis, unspecified: Secondary | ICD-10-CM | POA: Diagnosis not present

## 2021-06-06 DIAGNOSIS — R5383 Other fatigue: Secondary | ICD-10-CM | POA: Diagnosis not present

## 2021-06-06 DIAGNOSIS — E7800 Pure hypercholesterolemia, unspecified: Secondary | ICD-10-CM | POA: Diagnosis not present

## 2021-06-13 DIAGNOSIS — M150 Primary generalized (osteo)arthritis: Secondary | ICD-10-CM | POA: Diagnosis not present

## 2021-06-13 DIAGNOSIS — M0579 Rheumatoid arthritis with rheumatoid factor of multiple sites without organ or systems involvement: Secondary | ICD-10-CM | POA: Diagnosis not present

## 2021-06-13 DIAGNOSIS — M797 Fibromyalgia: Secondary | ICD-10-CM | POA: Diagnosis not present

## 2021-06-13 DIAGNOSIS — Z13220 Encounter for screening for lipoid disorders: Secondary | ICD-10-CM | POA: Diagnosis not present

## 2021-06-13 DIAGNOSIS — M2550 Pain in unspecified joint: Secondary | ICD-10-CM | POA: Diagnosis not present

## 2021-06-13 DIAGNOSIS — R5383 Other fatigue: Secondary | ICD-10-CM | POA: Diagnosis not present

## 2021-06-27 DIAGNOSIS — F5112 Insufficient sleep syndrome: Secondary | ICD-10-CM | POA: Diagnosis not present

## 2021-06-27 DIAGNOSIS — I10 Essential (primary) hypertension: Secondary | ICD-10-CM | POA: Diagnosis not present

## 2021-06-27 DIAGNOSIS — G4719 Other hypersomnia: Secondary | ICD-10-CM | POA: Diagnosis not present

## 2021-08-01 DIAGNOSIS — G4733 Obstructive sleep apnea (adult) (pediatric): Secondary | ICD-10-CM | POA: Diagnosis not present

## 2021-09-04 DIAGNOSIS — J029 Acute pharyngitis, unspecified: Secondary | ICD-10-CM | POA: Diagnosis not present

## 2021-09-04 DIAGNOSIS — R0602 Shortness of breath: Secondary | ICD-10-CM | POA: Diagnosis not present

## 2021-09-04 DIAGNOSIS — R059 Cough, unspecified: Secondary | ICD-10-CM | POA: Diagnosis not present

## 2021-09-04 DIAGNOSIS — U071 COVID-19: Secondary | ICD-10-CM | POA: Diagnosis not present

## 2021-09-08 DIAGNOSIS — G4733 Obstructive sleep apnea (adult) (pediatric): Secondary | ICD-10-CM | POA: Diagnosis not present

## 2021-09-26 DIAGNOSIS — R7303 Prediabetes: Secondary | ICD-10-CM | POA: Diagnosis not present

## 2021-09-26 DIAGNOSIS — Z124 Encounter for screening for malignant neoplasm of cervix: Secondary | ICD-10-CM | POA: Diagnosis not present

## 2021-09-26 DIAGNOSIS — Z0000 Encounter for general adult medical examination without abnormal findings: Secondary | ICD-10-CM | POA: Diagnosis not present

## 2021-10-09 DIAGNOSIS — G4733 Obstructive sleep apnea (adult) (pediatric): Secondary | ICD-10-CM | POA: Diagnosis not present

## 2021-10-14 DIAGNOSIS — M150 Primary generalized (osteo)arthritis: Secondary | ICD-10-CM | POA: Diagnosis not present

## 2021-10-14 DIAGNOSIS — M797 Fibromyalgia: Secondary | ICD-10-CM | POA: Diagnosis not present

## 2021-10-14 DIAGNOSIS — M0579 Rheumatoid arthritis with rheumatoid factor of multiple sites without organ or systems involvement: Secondary | ICD-10-CM | POA: Diagnosis not present

## 2021-10-14 DIAGNOSIS — M2550 Pain in unspecified joint: Secondary | ICD-10-CM | POA: Diagnosis not present

## 2021-11-06 DIAGNOSIS — G4733 Obstructive sleep apnea (adult) (pediatric): Secondary | ICD-10-CM | POA: Diagnosis not present

## 2021-12-07 DIAGNOSIS — G4733 Obstructive sleep apnea (adult) (pediatric): Secondary | ICD-10-CM | POA: Diagnosis not present

## 2022-01-06 DIAGNOSIS — G4733 Obstructive sleep apnea (adult) (pediatric): Secondary | ICD-10-CM | POA: Diagnosis not present

## 2022-01-29 DIAGNOSIS — K648 Other hemorrhoids: Secondary | ICD-10-CM | POA: Diagnosis not present

## 2022-01-29 DIAGNOSIS — Z1211 Encounter for screening for malignant neoplasm of colon: Secondary | ICD-10-CM | POA: Diagnosis not present

## 2022-01-29 DIAGNOSIS — K5730 Diverticulosis of large intestine without perforation or abscess without bleeding: Secondary | ICD-10-CM | POA: Diagnosis not present

## 2022-02-06 DIAGNOSIS — G4733 Obstructive sleep apnea (adult) (pediatric): Secondary | ICD-10-CM | POA: Diagnosis not present

## 2022-02-13 DIAGNOSIS — Z79899 Other long term (current) drug therapy: Secondary | ICD-10-CM | POA: Diagnosis not present

## 2022-02-13 DIAGNOSIS — M0579 Rheumatoid arthritis with rheumatoid factor of multiple sites without organ or systems involvement: Secondary | ICD-10-CM | POA: Diagnosis not present

## 2022-02-13 DIAGNOSIS — M797 Fibromyalgia: Secondary | ICD-10-CM | POA: Diagnosis not present

## 2022-02-13 DIAGNOSIS — M1991 Primary osteoarthritis, unspecified site: Secondary | ICD-10-CM | POA: Diagnosis not present

## 2022-03-08 DIAGNOSIS — G4733 Obstructive sleep apnea (adult) (pediatric): Secondary | ICD-10-CM | POA: Diagnosis not present

## 2022-04-08 DIAGNOSIS — G4733 Obstructive sleep apnea (adult) (pediatric): Secondary | ICD-10-CM | POA: Diagnosis not present

## 2022-05-06 DIAGNOSIS — G4733 Obstructive sleep apnea (adult) (pediatric): Secondary | ICD-10-CM | POA: Diagnosis not present

## 2022-05-09 DIAGNOSIS — G4733 Obstructive sleep apnea (adult) (pediatric): Secondary | ICD-10-CM | POA: Diagnosis not present

## 2022-06-05 DIAGNOSIS — M797 Fibromyalgia: Secondary | ICD-10-CM | POA: Diagnosis not present

## 2022-06-05 DIAGNOSIS — Z79899 Other long term (current) drug therapy: Secondary | ICD-10-CM | POA: Diagnosis not present

## 2022-06-05 DIAGNOSIS — M1991 Primary osteoarthritis, unspecified site: Secondary | ICD-10-CM | POA: Diagnosis not present

## 2022-06-05 DIAGNOSIS — G4733 Obstructive sleep apnea (adult) (pediatric): Secondary | ICD-10-CM | POA: Diagnosis not present

## 2022-06-05 DIAGNOSIS — M0579 Rheumatoid arthritis with rheumatoid factor of multiple sites without organ or systems involvement: Secondary | ICD-10-CM | POA: Diagnosis not present

## 2022-06-05 DIAGNOSIS — R5383 Other fatigue: Secondary | ICD-10-CM | POA: Diagnosis not present

## 2022-06-26 DIAGNOSIS — M0579 Rheumatoid arthritis with rheumatoid factor of multiple sites without organ or systems involvement: Secondary | ICD-10-CM | POA: Diagnosis not present

## 2022-07-24 DIAGNOSIS — M0579 Rheumatoid arthritis with rheumatoid factor of multiple sites without organ or systems involvement: Secondary | ICD-10-CM | POA: Diagnosis not present

## 2022-07-24 DIAGNOSIS — Z79899 Other long term (current) drug therapy: Secondary | ICD-10-CM | POA: Diagnosis not present

## 2022-07-31 DIAGNOSIS — M069 Rheumatoid arthritis, unspecified: Secondary | ICD-10-CM | POA: Diagnosis not present

## 2022-07-31 DIAGNOSIS — E7800 Pure hypercholesterolemia, unspecified: Secondary | ICD-10-CM | POA: Diagnosis not present

## 2022-07-31 DIAGNOSIS — I10 Essential (primary) hypertension: Secondary | ICD-10-CM | POA: Diagnosis not present

## 2022-07-31 DIAGNOSIS — R7303 Prediabetes: Secondary | ICD-10-CM | POA: Diagnosis not present

## 2022-07-31 DIAGNOSIS — Z6841 Body Mass Index (BMI) 40.0 and over, adult: Secondary | ICD-10-CM | POA: Diagnosis not present

## 2022-08-21 DIAGNOSIS — M0579 Rheumatoid arthritis with rheumatoid factor of multiple sites without organ or systems involvement: Secondary | ICD-10-CM | POA: Diagnosis not present

## 2022-09-25 DIAGNOSIS — M797 Fibromyalgia: Secondary | ICD-10-CM | POA: Diagnosis not present

## 2022-09-25 DIAGNOSIS — M1991 Primary osteoarthritis, unspecified site: Secondary | ICD-10-CM | POA: Diagnosis not present

## 2022-09-25 DIAGNOSIS — Z79899 Other long term (current) drug therapy: Secondary | ICD-10-CM | POA: Diagnosis not present

## 2022-09-25 DIAGNOSIS — M0579 Rheumatoid arthritis with rheumatoid factor of multiple sites without organ or systems involvement: Secondary | ICD-10-CM | POA: Diagnosis not present

## 2022-10-20 DIAGNOSIS — R7303 Prediabetes: Secondary | ICD-10-CM | POA: Diagnosis not present

## 2022-10-20 DIAGNOSIS — I10 Essential (primary) hypertension: Secondary | ICD-10-CM | POA: Diagnosis not present

## 2022-10-20 DIAGNOSIS — Z79899 Other long term (current) drug therapy: Secondary | ICD-10-CM | POA: Diagnosis not present

## 2022-10-20 DIAGNOSIS — E7849 Other hyperlipidemia: Secondary | ICD-10-CM | POA: Diagnosis not present

## 2022-10-20 DIAGNOSIS — Z23 Encounter for immunization: Secondary | ICD-10-CM | POA: Diagnosis not present

## 2022-12-02 ENCOUNTER — Other Ambulatory Visit: Payer: Self-pay | Admitting: Family Medicine

## 2022-12-02 DIAGNOSIS — Z1231 Encounter for screening mammogram for malignant neoplasm of breast: Secondary | ICD-10-CM

## 2022-12-16 DIAGNOSIS — R0602 Shortness of breath: Secondary | ICD-10-CM | POA: Diagnosis not present

## 2022-12-18 DIAGNOSIS — N644 Mastodynia: Secondary | ICD-10-CM | POA: Diagnosis not present

## 2022-12-21 DIAGNOSIS — F17200 Nicotine dependence, unspecified, uncomplicated: Secondary | ICD-10-CM | POA: Diagnosis not present

## 2022-12-21 DIAGNOSIS — J984 Other disorders of lung: Secondary | ICD-10-CM | POA: Diagnosis not present

## 2023-01-01 DIAGNOSIS — Z79899 Other long term (current) drug therapy: Secondary | ICD-10-CM | POA: Diagnosis not present

## 2023-01-01 DIAGNOSIS — M797 Fibromyalgia: Secondary | ICD-10-CM | POA: Diagnosis not present

## 2023-01-01 DIAGNOSIS — M1991 Primary osteoarthritis, unspecified site: Secondary | ICD-10-CM | POA: Diagnosis not present

## 2023-01-01 DIAGNOSIS — M0579 Rheumatoid arthritis with rheumatoid factor of multiple sites without organ or systems involvement: Secondary | ICD-10-CM | POA: Diagnosis not present

## 2023-01-06 DIAGNOSIS — N644 Mastodynia: Secondary | ICD-10-CM | POA: Diagnosis not present

## 2023-04-23 DIAGNOSIS — Z0000 Encounter for general adult medical examination without abnormal findings: Secondary | ICD-10-CM | POA: Diagnosis not present

## 2023-04-23 DIAGNOSIS — I10 Essential (primary) hypertension: Secondary | ICD-10-CM | POA: Diagnosis not present

## 2023-04-23 DIAGNOSIS — R7303 Prediabetes: Secondary | ICD-10-CM | POA: Diagnosis not present

## 2023-04-23 DIAGNOSIS — Z6841 Body Mass Index (BMI) 40.0 and over, adult: Secondary | ICD-10-CM | POA: Diagnosis not present

## 2023-04-23 DIAGNOSIS — Z23 Encounter for immunization: Secondary | ICD-10-CM | POA: Diagnosis not present

## 2023-04-23 DIAGNOSIS — Z1159 Encounter for screening for other viral diseases: Secondary | ICD-10-CM | POA: Diagnosis not present

## 2023-04-30 DIAGNOSIS — M160 Bilateral primary osteoarthritis of hip: Secondary | ICD-10-CM | POA: Diagnosis not present

## 2023-04-30 DIAGNOSIS — M25561 Pain in right knee: Secondary | ICD-10-CM | POA: Diagnosis not present

## 2023-04-30 DIAGNOSIS — M25551 Pain in right hip: Secondary | ICD-10-CM | POA: Diagnosis not present

## 2023-04-30 DIAGNOSIS — G8929 Other chronic pain: Secondary | ICD-10-CM | POA: Diagnosis not present

## 2023-05-31 IMAGING — MG MM DIGITAL SCREENING BILAT W/ TOMO AND CAD
8 series · 8 of 24 positions shown · non-contrast
Comparison: Previous exam(s).

CLINICAL DATA: Screening.

EXAM:
DIGITAL SCREENING BILATERAL MAMMOGRAM WITH TOMOSYNTHESIS AND CAD
TECHNIQUE: Bilateral screening digital craniocaudal and mediolateral oblique
mammograms were obtained. Bilateral screening digital breast
tomosynthesis was performed. The images were evaluated with
computer-aided detection.

[R MLO synth-2D]
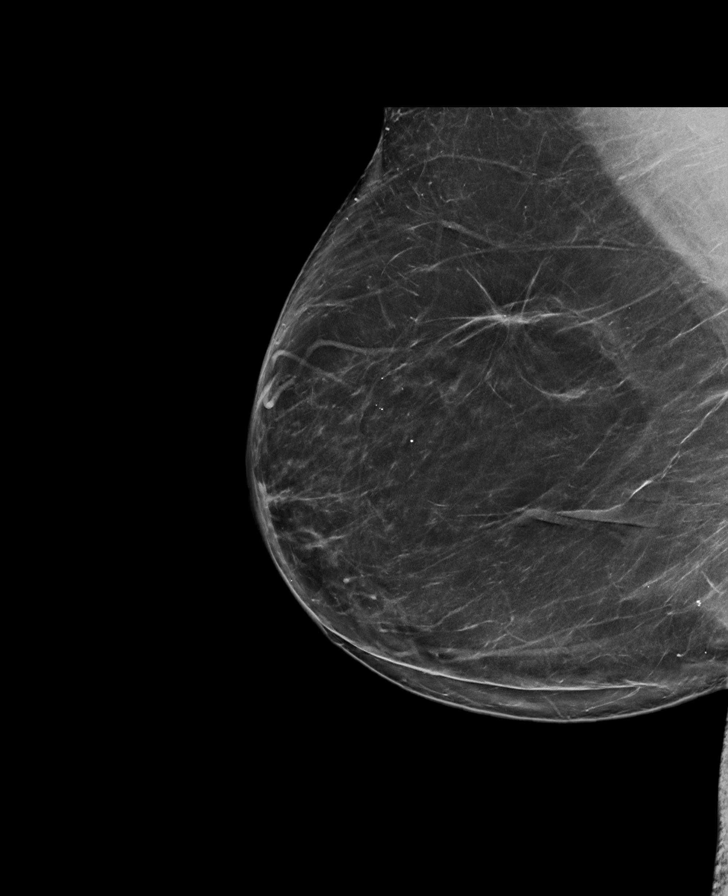

[R CC synth-2D]
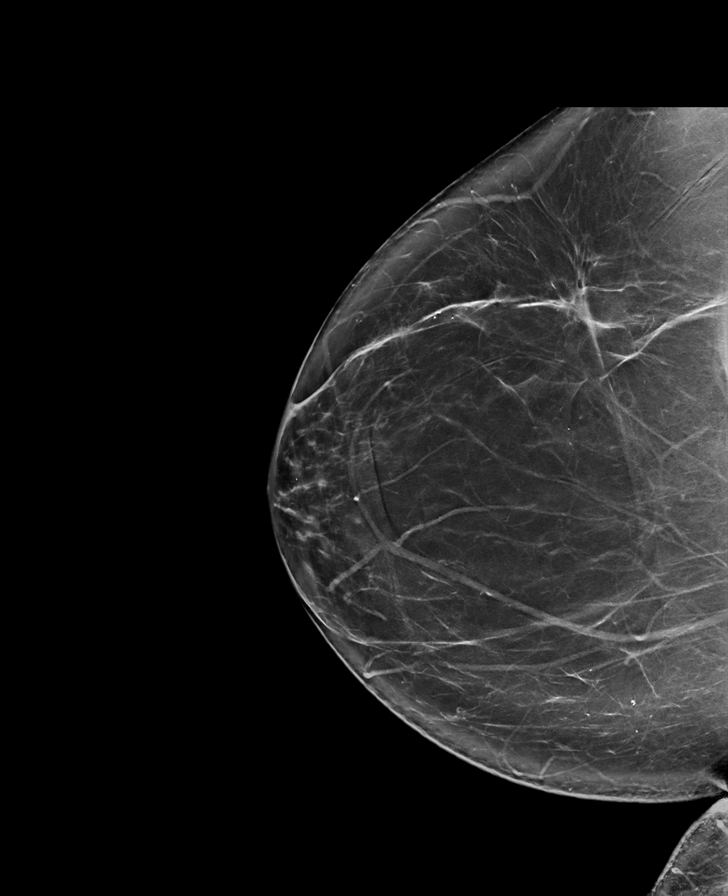

[L MLO synth-2D]
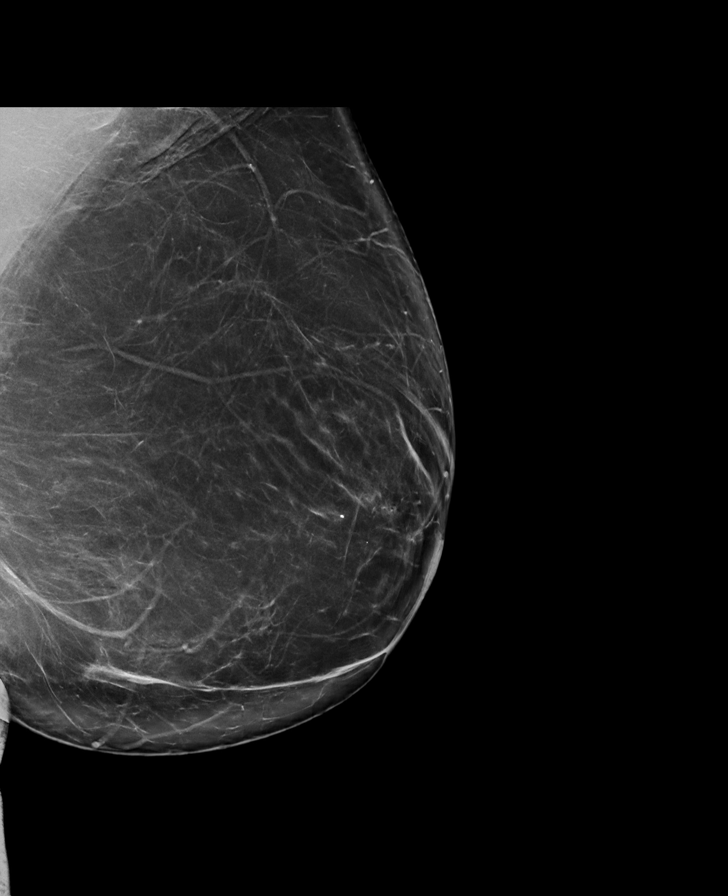

[L CC synth-2D]
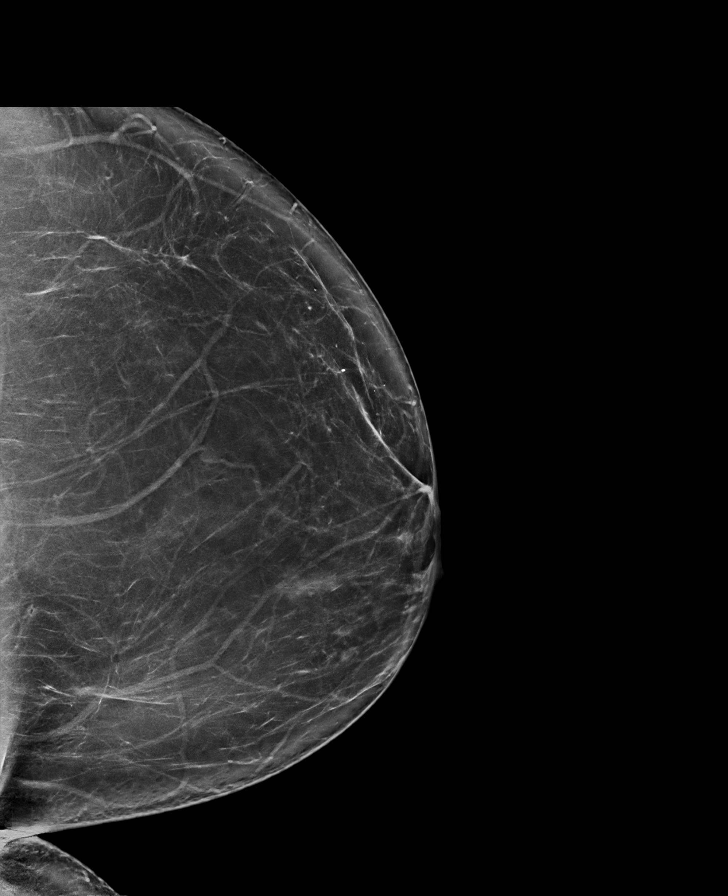

[R CC tomo · tomo slice 45/88.0]
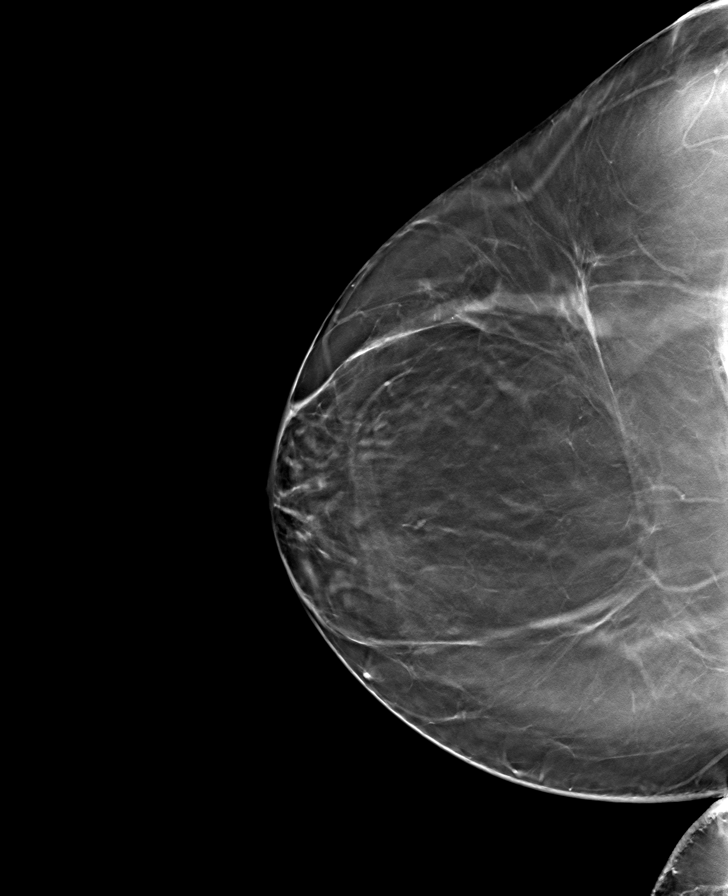

[L CC tomo · tomo slice 45/89.0]
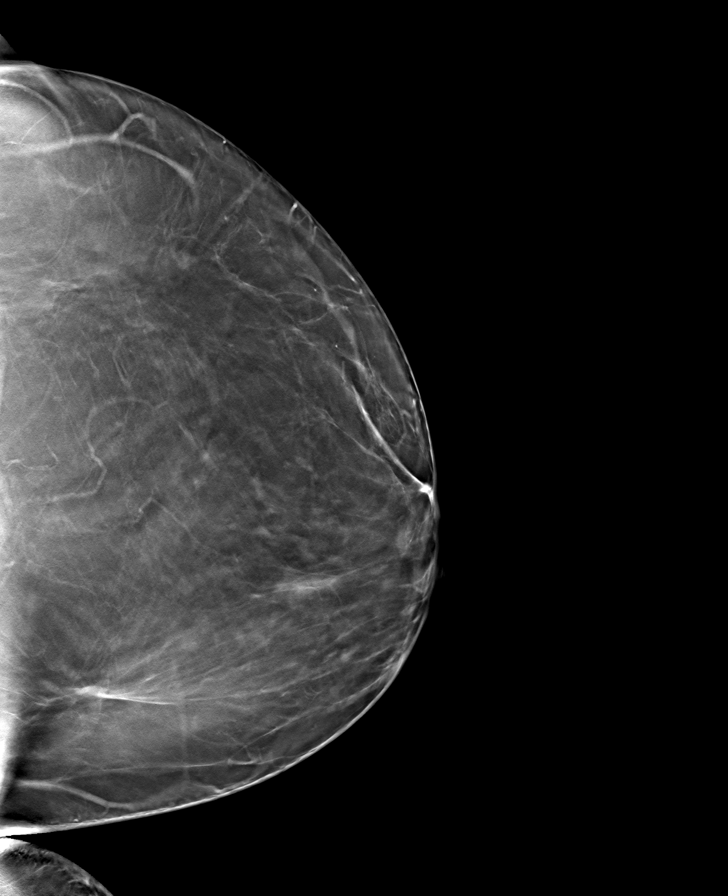

[L MLO tomo · tomo slice 47/93.0]
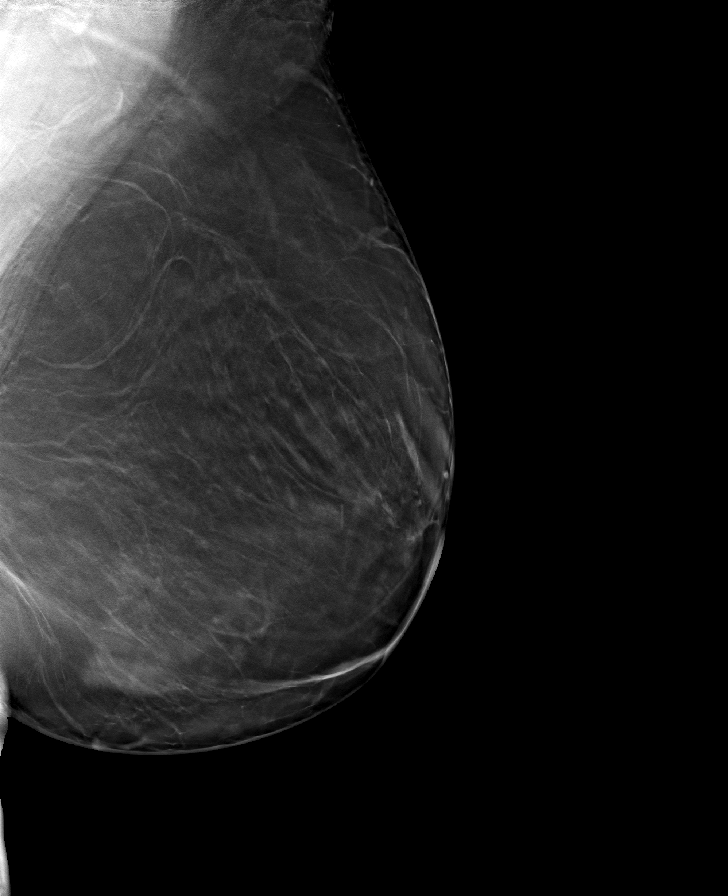

[R MLO tomo · tomo slice 45/90.0]
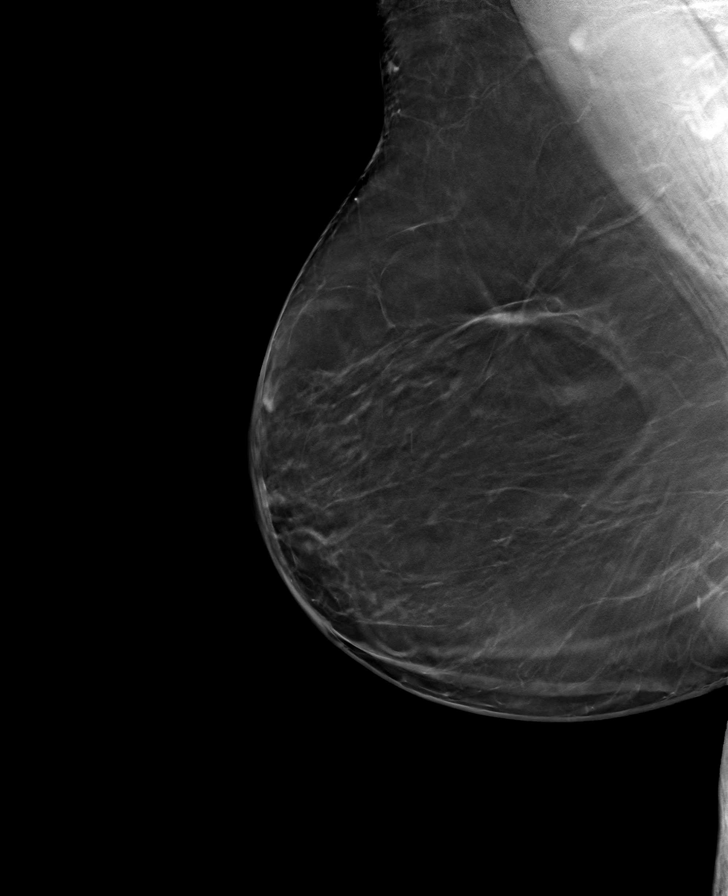

[8 of 24 positions shown; findings below may reference images not displayed]

ACR Breast Density Category b: There are scattered areas of
fibroglandular density.
FINDINGS: There are no findings suspicious for malignancy.
IMPRESSION: No mammographic evidence of malignancy. A result letter of this
screening mammogram will be mailed directly to the patient.

RECOMMENDATION:
Screening mammogram in one year. (Code:51-O-LD2)

BI-RADS CATEGORY  1: Negative.

## 2023-06-28 DIAGNOSIS — R059 Cough, unspecified: Secondary | ICD-10-CM | POA: Diagnosis not present

## 2023-06-28 DIAGNOSIS — J209 Acute bronchitis, unspecified: Secondary | ICD-10-CM | POA: Diagnosis not present

## 2023-06-28 DIAGNOSIS — R0981 Nasal congestion: Secondary | ICD-10-CM | POA: Diagnosis not present

## 2023-07-02 DIAGNOSIS — M797 Fibromyalgia: Secondary | ICD-10-CM | POA: Diagnosis not present

## 2023-07-02 DIAGNOSIS — M0579 Rheumatoid arthritis with rheumatoid factor of multiple sites without organ or systems involvement: Secondary | ICD-10-CM | POA: Diagnosis not present

## 2023-07-02 DIAGNOSIS — M1991 Primary osteoarthritis, unspecified site: Secondary | ICD-10-CM | POA: Diagnosis not present

## 2023-07-02 DIAGNOSIS — Z79899 Other long term (current) drug therapy: Secondary | ICD-10-CM | POA: Diagnosis not present

## 2023-07-27 DIAGNOSIS — M5431 Sciatica, right side: Secondary | ICD-10-CM | POA: Diagnosis not present

## 2023-07-27 DIAGNOSIS — M4316 Spondylolisthesis, lumbar region: Secondary | ICD-10-CM | POA: Diagnosis not present

## 2023-08-06 DIAGNOSIS — D509 Iron deficiency anemia, unspecified: Secondary | ICD-10-CM | POA: Diagnosis not present
